# Patient Record
Sex: Female | Born: 2000 | Race: White | Hispanic: No | Marital: Single | Smoking: Never smoker
Health system: Southern US, Community
[De-identification: ages and names within clinical notes are randomized; demographics above are authoritative.]

---
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
## 2019-06-02 IMAGING — US US ABDOMEN LIMITED
1 series · 14 of 25 positions shown · non-contrast
Comparison: None.

CLINICAL DATA: 15-year-old female with a history of epigastric pain

EXAM:
ULTRASOUND ABDOMEN LIMITED RIGHT UPPER QUADRANT

[Series 1: us abdomen limited · 0.21mm/px · 14 of 41 slices shown]
[im 1/41]
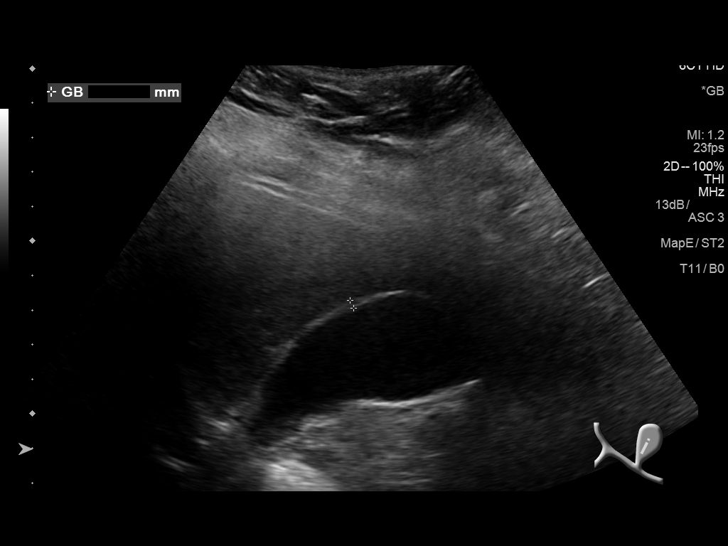
[im 4/41]
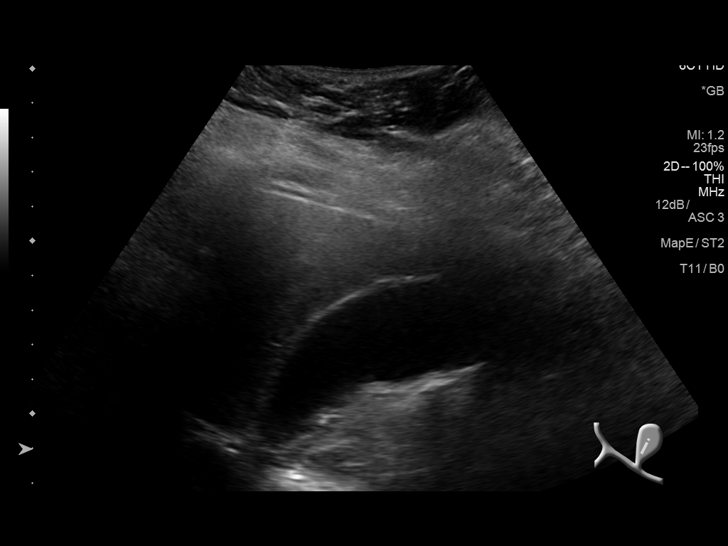
[im 7/41]
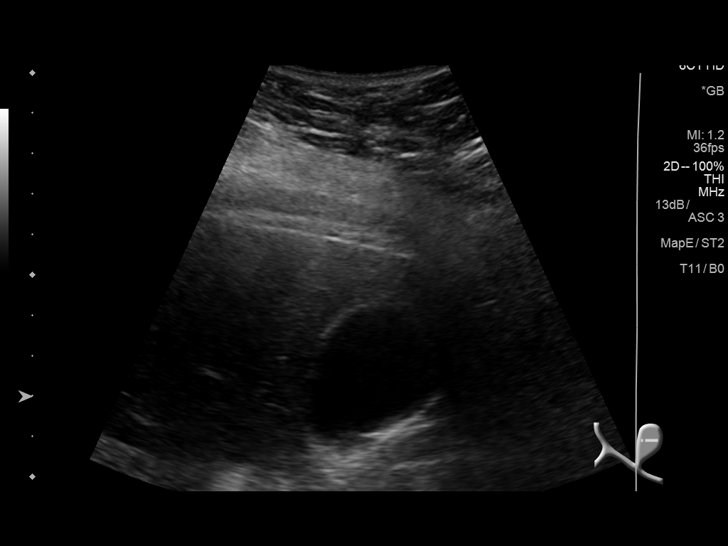
[im 11/41]
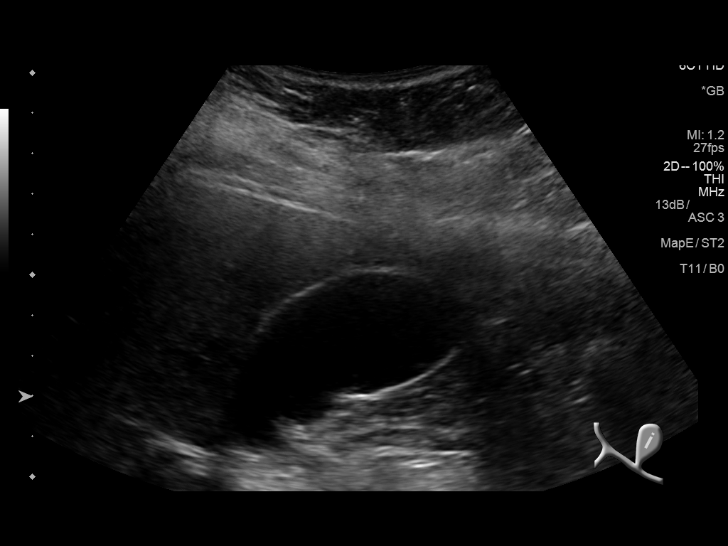
[im 14/41]
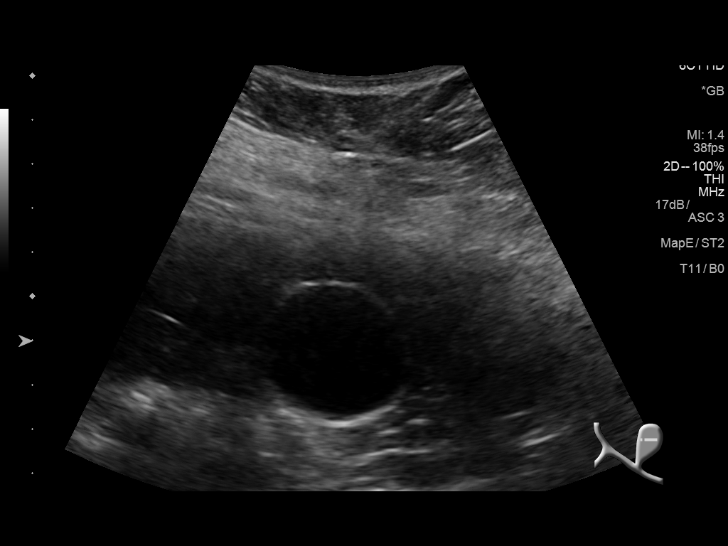
[im 16/41]
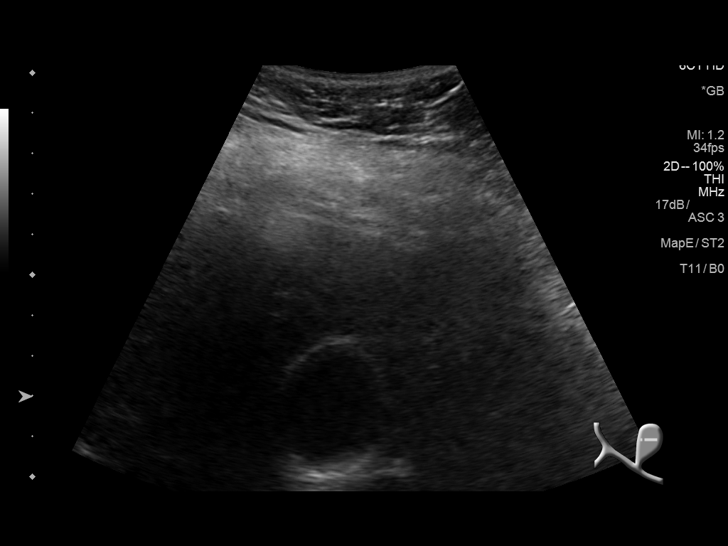
[im 19/41]
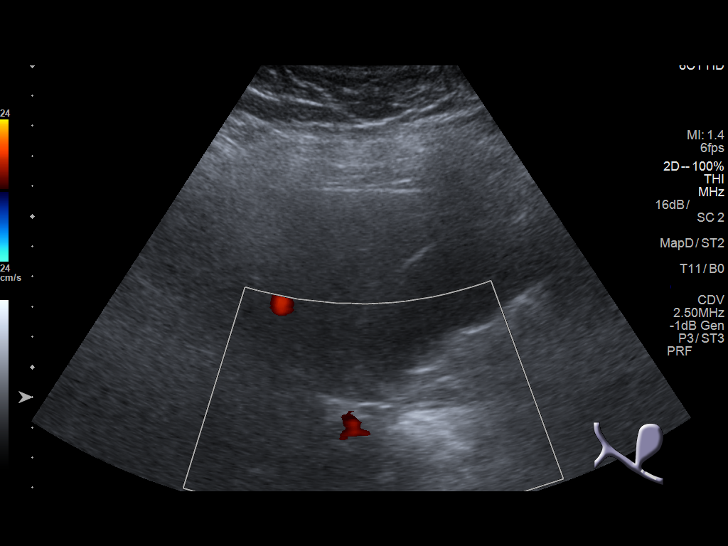
[im 22/41]
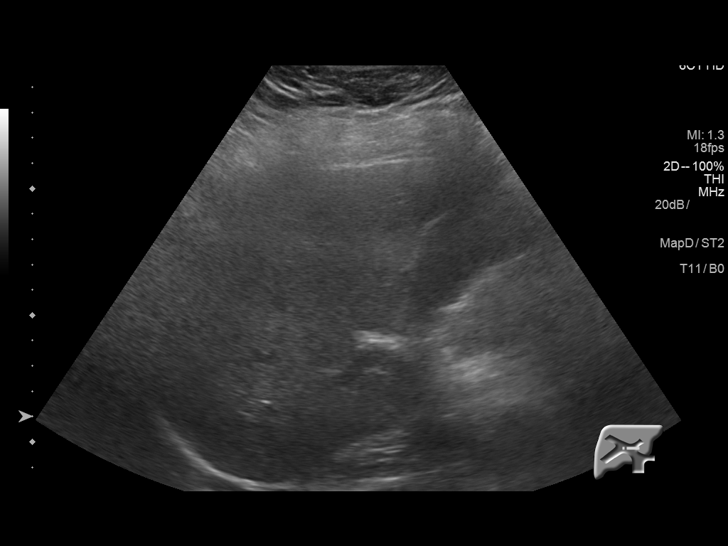
[im 26/41]
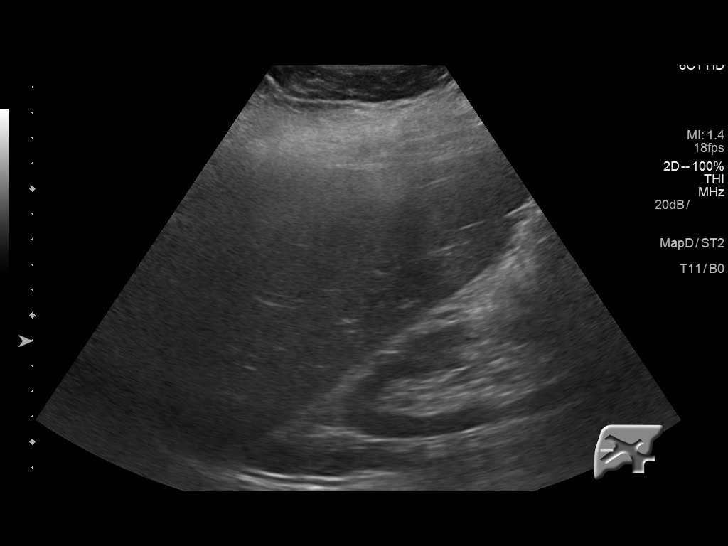
[im 27/41]
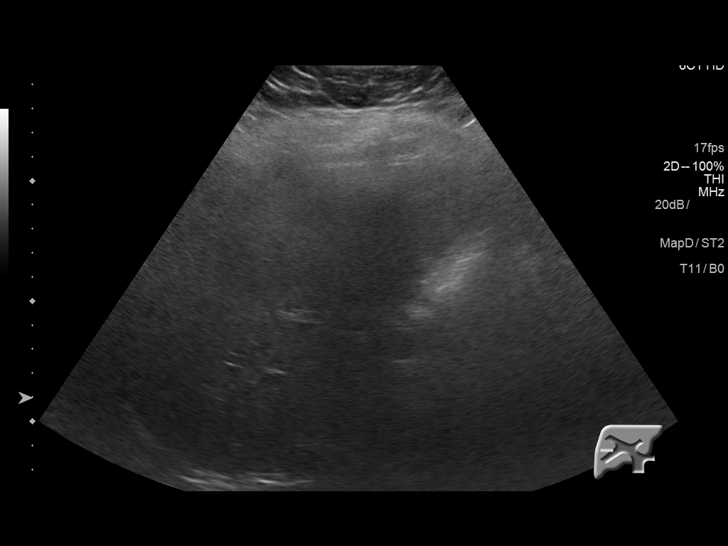
[im 31/41]
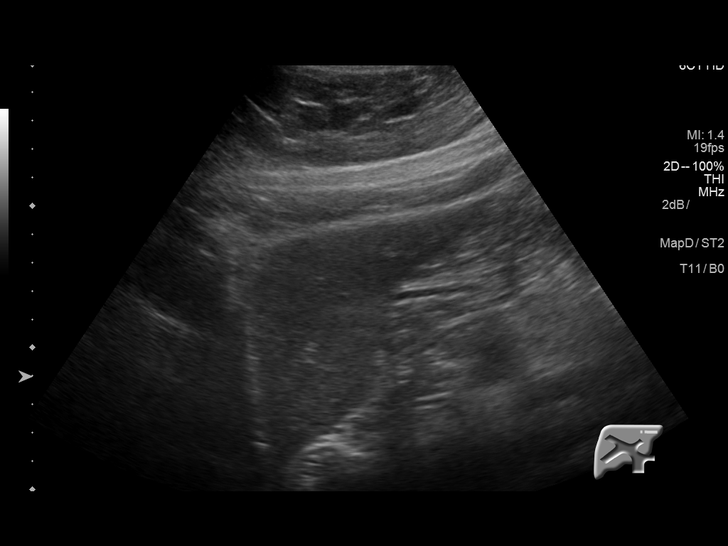
[im 34/41]
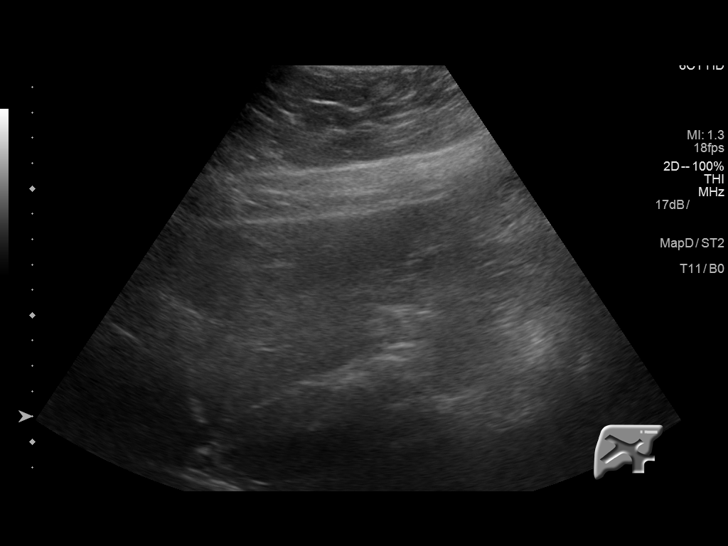
[im 37/41]
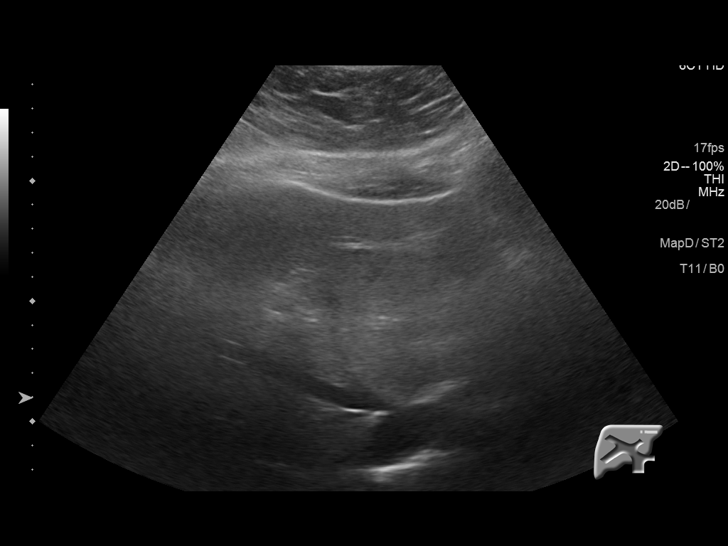
[im 41/41]
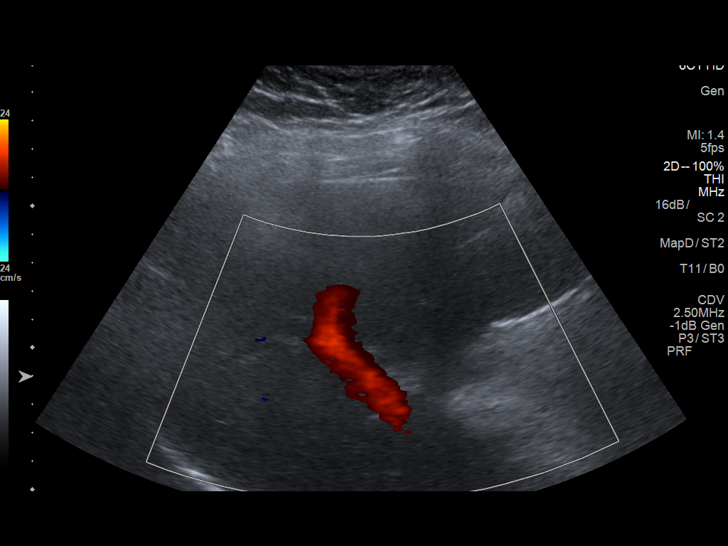

[14 of 25 positions shown; findings below may reference images not displayed]

FINDINGS: Gallbladder:

No gallstones or wall thickening visualized. No sonographic Murphy
sign noted by sonographer.

Common bile duct:

Diameter: 3 mm

Liver:

No focal lesion identified. Within normal limits in parenchymal
echogenicity. Portal vein is patent on color Doppler imaging with
normal direction of blood flow towards the liver.
IMPRESSION: Unremarkable sonographic survey of the right upper quadrant.

## 2020-07-29 ENCOUNTER — Encounter (HOSPITAL_COMMUNITY): Payer: Self-pay | Admitting: Emergency Medicine

## 2020-07-29 ENCOUNTER — Other Ambulatory Visit: Payer: Self-pay

## 2020-07-29 ENCOUNTER — Emergency Department (HOSPITAL_COMMUNITY)
Admission: EM | Admit: 2020-07-29 | Discharge: 2020-07-29 | Disposition: A | Discharge status: Home / Self Care | Payer: Self-pay | Attending: Emergency Medicine | Admitting: Emergency Medicine

## 2020-07-29 DIAGNOSIS — G510 Bell's palsy: Secondary | ICD-10-CM | POA: Insufficient documentation

## 2020-07-29 DIAGNOSIS — R200 Anesthesia of skin: Secondary | ICD-10-CM | POA: Insufficient documentation

## 2020-07-29 DIAGNOSIS — R519 Headache, unspecified: Secondary | ICD-10-CM | POA: Insufficient documentation

## 2020-07-29 DIAGNOSIS — R030 Elevated blood-pressure reading, without diagnosis of hypertension: Secondary | ICD-10-CM | POA: Insufficient documentation

## 2020-07-29 MED ORDER — VALACYCLOVIR HCL 500 MG PO TABS: 500.0000 mg | ORAL_TABLET | Freq: Two times a day (BID) | ORAL | 0 refills | Status: AC

## 2020-07-29 MED ORDER — PREDNISONE 50 MG PO TABS
60.0000 mg | ORAL_TABLET | Freq: Once | ORAL | Status: AC
  Administered 2020-07-29: 04:00:00 60 mg via ORAL
  Filled 2020-07-29: qty 1

## 2020-07-29 MED ORDER — PREDNISONE 20 MG PO TABS: 60.0000 mg | ORAL_TABLET | Freq: Every day | ORAL | 0 refills | Status: AC

## 2020-07-29 NOTE — ED Triage Notes (Signed)
Pt states she had a headache 2 days ago and then woke up with left sided facial numbness. Pt states the numbness has been going on since then.

## 2020-07-29 NOTE — ED Provider Notes (Signed)
[facility] EMERGENCY DEPARTMENT Provider Note   CSN: [id number] Arrival date & time: 07/29/20  0310     History Chief Complaint  Patient presents with   facial numbness    Shannon Espinoza is a 20 y.o. female.  The history is provided by the patient.  She comes in because of numbness in the left side of her face which started about 2 days ago and seems to be getting worse.  It seems to be spreading down towards her neck.  She has noted liquids or dribbling for the left side of her mouth.  She has noted that her left eye has felt irritated.  She had a midline headache preceding the onset of her numbness.  She actually has been having fairly constant headaches for the last several years which seem to be getting worse.  He denies any arm or leg numbness or weakness.  She denies any nausea or vomiting.  She denies any ear pain.   History reviewed. No pertinent past medical history.  There are no problems to display for this patient.   History reviewed. No pertinent surgical history.   OB History     Gravida  0   Para  0   Term  0   Preterm  0   AB  0   Living  0      SAB  0   IAB  0   Ectopic  0   Multiple  0   Live Births  0           History reviewed. No pertinent family history.  Social History   Tobacco Use   Smoking status: Never   Smokeless tobacco: Never  Vaping Use   Vaping Use: Never used  Substance Use Topics   Alcohol use: No   Drug use: No    Home Medications Prior to Admission medications   Medication Sig Start Date End Date Taking? Authorizing Provider  predniSONE (DELTASONE) 20 MG tablet Take 3 tablets (60 mg total) by mouth daily. 07/29/20  Yes Dione Booze, MD  valACYclovir (VALTREX) 500 MG tablet Take 1 tablet (500 mg total) by mouth 2 (two) times daily for 5 days. 07/29/20 08/03/20 Yes Dione Booze, MD  famotidine (PEPCID) 20 MG tablet Take 1 tablet (20 mg total) by mouth daily. 10/19/16   Samuel Jester, DO  ondansetron  (ZOFRAN ODT) 4 MG disintegrating tablet Take 1 tablet (4 mg total) by mouth every 8 (eight) hours as needed for nausea or vomiting. 10/19/16   Samuel Jester, DO    Allergies    Eggs or egg-derived products  Review of Systems   Review of Systems  All other systems reviewed and are negative.  Physical Exam Updated Vital Signs BP (!) 150/89 (BP Location: Left Arm)   Pulse 93   Temp 98.5 F (36.9 C) (Oral)   Resp 20   Ht 5\' 7"  (1.702 m)   Wt 108.9 kg   SpO2 100%   BMI 37.59 kg/m   Physical Exam Vitals and nursing note reviewed.  20 year old female, resting comfortably and in no acute distress. Vital signs are significant for elevated blood pressure. Oxygen saturation is 100%, which is normal. Head is normocephalic and atraumatic. PERRLA, EOMI. Oropharynx is clear. Neck is nontender and supple without adenopathy or JVD. Back is nontender and there is no CVA tenderness. Lungs are clear without rales, wheezes, or rhonchi. Chest is nontender. Heart has regular rate and rhythm without murmur. Abdomen is soft,  flat, nontender without masses or hepatosplenomegaly and peristalsis is normoactive. Extremities have no cyanosis or edema, full range of motion is present. Skin is warm and dry without rash. Neurologic: Mental status is normal.  There is a moderate left-sided peripheral facial droop.  She is unable to close her left eye completely.  There is slight subjective decrease sensation on the left side of her face compared with the right, but there is no extinction on double simultaneous stimulation.  Tongue protrudes in the midline.  Shrug is symmetric.  Motor strength is 5/5 in both arms and both legs.  There is no pronator drift.  Peripheral sensory exam is normal.  Station and gait are normal.  ED Results / Procedures / Treatments   Labs (all labs ordered are listed, but only abnormal results are displayed) Labs Reviewed - No data to display  EKG EKG  Interpretation  Date/Time:  Monday July 29 2020 03:21:43 EDT Ventricular Rate:  70 PR Interval:  137 QRS Duration: 100 QT Interval:  390 QTC Calculation: 421 R Axis:   82 Text Interpretation: Sinus arrhythmia Baseline wander in lead(s) II III aVR aVL aVF V1 Otherwise within normal limits No old tracing to compare Confirmed by Dione Booze ([id number]) on 07/29/2020 3:31:54 AM  Procedures Procedures   Medications Ordered in ED Medications  predniSONE (DELTASONE) tablet 60 mg (has no administration in time range)    ED Course  I have reviewed the triage vital signs and the nursing notes.   MDM Rules/Calculators/A&P                         Left-sided Bell's palsy.  No red flags to suggest more serious pathology.  Old records reviewed, and she has no relevant past visits.  She is discharged with prescription for prednisone and valacyclovir.  Because of her concern about headaches over the last 2 years, she is referred to neurology for follow-up.  ECG was obtained per protocol and is normal.  Final Clinical Impression(s) / ED Diagnoses Final diagnoses:  Left-sided Bell's palsy  Elevated blood-pressure reading without diagnosis of hypertension    Rx / DC Orders ED Discharge Orders          Ordered    predniSONE (DELTASONE) 20 MG tablet  Daily        07/29/20 0355    valACYclovir (VALTREX) 500 MG tablet  2 times daily        07/29/20 0355             Dione Booze, MD 07/29/20 0401

## 2022-05-28 ENCOUNTER — Encounter (HOSPITAL_COMMUNITY): Payer: Self-pay | Admitting: Emergency Medicine

## 2022-05-28 ENCOUNTER — Other Ambulatory Visit: Payer: Self-pay

## 2022-05-28 DIAGNOSIS — Z5321 Procedure and treatment not carried out due to patient leaving prior to being seen by health care provider: Secondary | ICD-10-CM | POA: Insufficient documentation

## 2022-05-28 DIAGNOSIS — N764 Abscess of vulva: Secondary | ICD-10-CM | POA: Insufficient documentation

## 2022-05-28 NOTE — ED Triage Notes (Signed)
Pt c/o labial abscess for the past 2 days.

## 2022-05-29 ENCOUNTER — Emergency Department (HOSPITAL_COMMUNITY)
Admission: EM | Admit: 2022-05-29 | Discharge: 2022-05-29 | Discharge status: Against Medical Advice | Payer: Self-pay | Attending: Emergency Medicine | Admitting: Emergency Medicine
